# Patient Record
Sex: Female | Born: 1935 | Race: White | Hispanic: No
Health system: Southern US, Community
[De-identification: ages and names within clinical notes are randomized; demographics above are authoritative.]

---

## 2004-07-16 ENCOUNTER — Other Ambulatory Visit: Payer: Self-pay

## 2004-07-16 ENCOUNTER — Emergency Department: Payer: Self-pay | Admitting: Emergency Medicine

## 2004-09-07 ENCOUNTER — Emergency Department: Payer: Self-pay | Admitting: Emergency Medicine

## 2004-09-08 ENCOUNTER — Ambulatory Visit: Payer: Self-pay | Admitting: Emergency Medicine

## 2006-10-20 ENCOUNTER — Ambulatory Visit: Payer: Self-pay | Admitting: Internal Medicine

## 2006-12-22 ENCOUNTER — Emergency Department: Payer: Self-pay | Admitting: Emergency Medicine

## 2006-12-22 ENCOUNTER — Other Ambulatory Visit: Payer: Self-pay

## 2006-12-29 ENCOUNTER — Emergency Department: Payer: Self-pay | Admitting: Emergency Medicine

## 2007-01-05 ENCOUNTER — Ambulatory Visit: Payer: Self-pay | Admitting: Internal Medicine

## 2007-01-10 ENCOUNTER — Other Ambulatory Visit: Payer: Self-pay

## 2007-01-10 ENCOUNTER — Inpatient Hospital Stay: Payer: Self-pay | Admitting: Internal Medicine

## 2007-02-01 ENCOUNTER — Other Ambulatory Visit: Payer: Self-pay

## 2007-02-01 ENCOUNTER — Observation Stay: Payer: Self-pay | Admitting: Internal Medicine

## 2007-03-22 ENCOUNTER — Other Ambulatory Visit: Payer: Self-pay

## 2007-03-22 ENCOUNTER — Observation Stay: Payer: Self-pay | Admitting: Internal Medicine

## 2009-03-18 IMAGING — CR NECK SOFT TISSUES - 1+ VIEW
1 series · 2 of 2 positions shown · non-contrast
Comparison: none

REASON FOR EXAM: angioedema
COMMENTS:

[Series 1: view not recorded · 0.17mm/px · 2 of 2 slices shown]
[im 1/2]
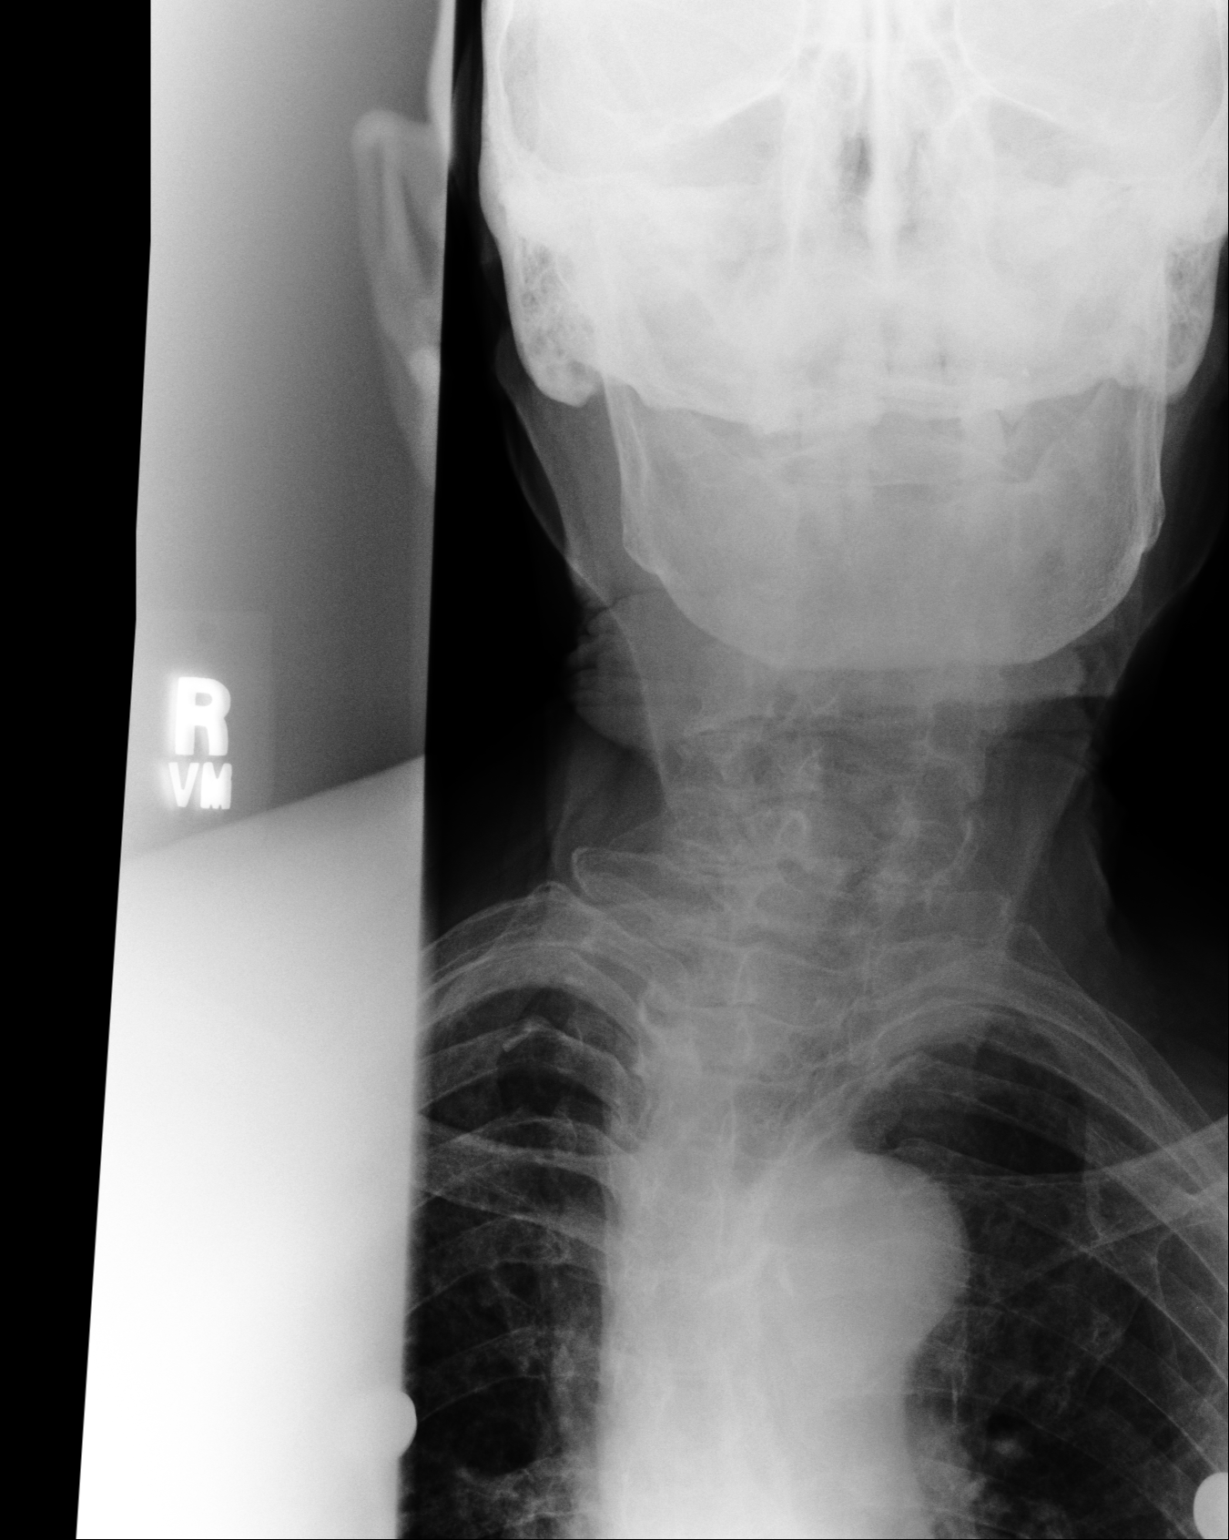
[im 2/2]
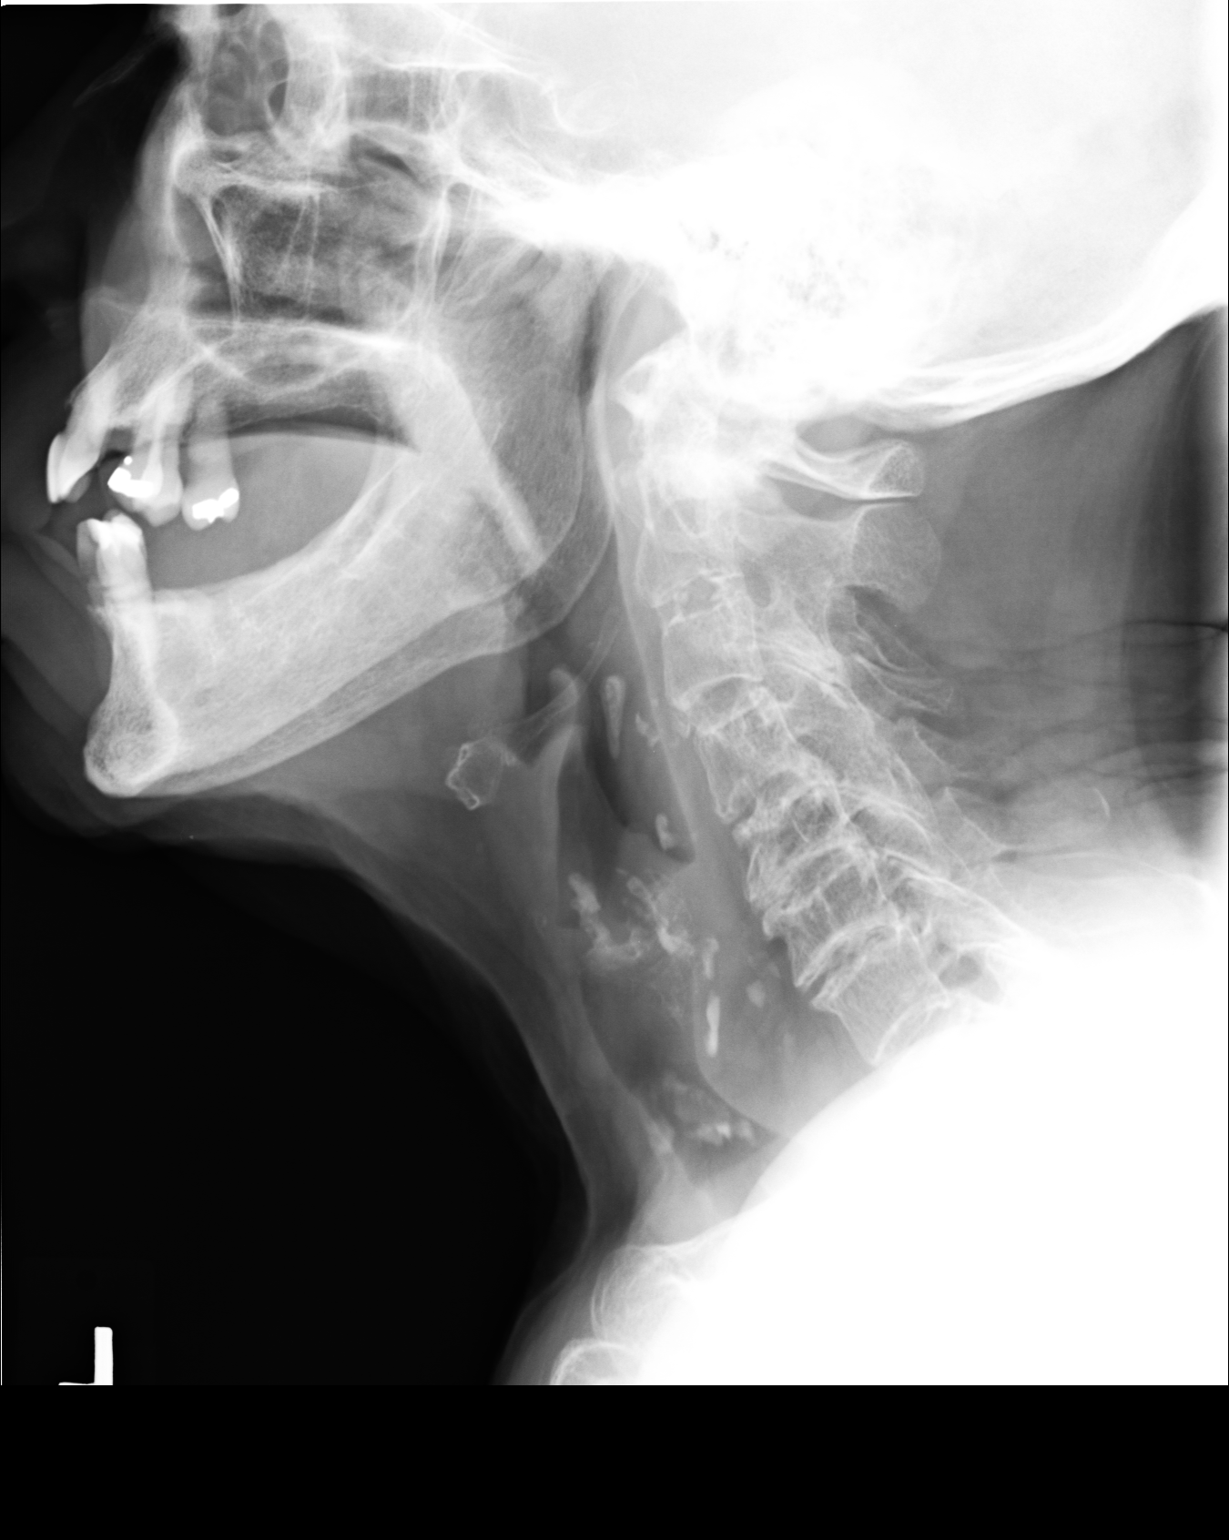

[2 of 2 positions shown; findings below may reference images not displayed]

PROCEDURE:     DXR - DXR SOFT TISSUE NECK  - February 01, 2007  [DATE]

RESULT:     Soft tissue AP and lateral views of the neck reveal the cervical
airway to be patent. There is a radiodensity that projects just posterior to
the epiglottis that is of uncertain etiology. There is extensive
calcification within the soft tissues of the neck which are not clearly
associated with the esophagus or airway but these are seen on the lateral
view only. There is degenerative disc disease of the spine.
IMPRESSION: 1. There is bony density lying posterior to the epiglottis on the lateral
film. If there are clinical concerns of ingestion of a foreign body, then
this would be considered a likely candidate.
2. There are calcifications that are more amorphous in appearance that
likely are related to the thyroid cartilage and to the carotid vessels.
3. I do not see evidence of epiglottic edema or prevertebral soft tissue
swelling.
4. There are degenerative changes of the spine.

## 2009-04-29 ENCOUNTER — Emergency Department: Payer: Self-pay | Admitting: Emergency Medicine

## 2011-03-24 ENCOUNTER — Emergency Department: Payer: Self-pay | Admitting: *Deleted

## 2014-01-04 ENCOUNTER — Other Ambulatory Visit: Payer: Self-pay | Admitting: Geriatric Medicine

## 2014-01-04 LAB — URINALYSIS, COMPLETE
Bacteria: NONE SEEN
Bilirubin,UR: NEGATIVE
Blood: NEGATIVE
Glucose,UR: NEGATIVE mg/dL (ref 0–75)
Ketone: NEGATIVE
NITRITE: NEGATIVE
Ph: 6 (ref 4.5–8.0)
Protein: NEGATIVE
RBC, UR: NONE SEEN /HPF (ref 0–5)
Specific Gravity: 1.01 (ref 1.003–1.030)
WBC UR: 120 /HPF (ref 0–5)

## 2014-01-06 LAB — URINE CULTURE

## 2014-01-10 ENCOUNTER — Emergency Department: Payer: Self-pay | Admitting: Student

## 2014-01-11 ENCOUNTER — Emergency Department: Payer: Self-pay | Admitting: Emergency Medicine

## 2014-01-11 LAB — BASIC METABOLIC PANEL
Anion Gap: 3 — ABNORMAL LOW (ref 7–16)
BUN: 25 mg/dL — ABNORMAL HIGH (ref 7–18)
CALCIUM: 8.8 mg/dL (ref 8.5–10.1)
CHLORIDE: 104 mmol/L (ref 98–107)
CO2: 30 mmol/L (ref 21–32)
CREATININE: 1 mg/dL (ref 0.60–1.30)
EGFR (African American): 55 — ABNORMAL LOW
GFR CALC NON AF AMER: 48 — AB
GLUCOSE: 144 mg/dL — AB (ref 65–99)
Osmolality: 281 (ref 275–301)
POTASSIUM: 3.9 mmol/L (ref 3.5–5.1)
Sodium: 137 mmol/L (ref 136–145)

## 2014-01-11 LAB — CBC
HCT: 31.2 % — ABNORMAL LOW (ref 35.0–47.0)
HGB: 10.4 g/dL — AB (ref 12.0–16.0)
MCH: 33.2 pg (ref 26.0–34.0)
MCHC: 33.5 g/dL (ref 32.0–36.0)
MCV: 99 fL (ref 80–100)
PLATELETS: 123 10*3/uL — AB (ref 150–440)
RBC: 3.15 10*6/uL — AB (ref 3.80–5.20)
RDW: 13.4 % (ref 11.5–14.5)
WBC: 7.7 10*3/uL (ref 3.6–11.0)

## 2014-01-11 LAB — PROTIME-INR
INR: 1.1
Prothrombin Time: 14.5 secs (ref 11.5–14.7)

## 2014-03-01 ENCOUNTER — Ambulatory Visit: Payer: Self-pay | Admitting: Internal Medicine

## 2014-03-08 ENCOUNTER — Emergency Department: Payer: Self-pay | Admitting: Emergency Medicine

## 2014-04-01 ENCOUNTER — Ambulatory Visit: Payer: Self-pay | Admitting: Internal Medicine

## 2014-09-22 NOTE — Consult Note (Signed)
   Comments   I have spoken by phone with pt's son, Lurlean NannyJohnny Wood, in [place] (# [phone]). He confirms that he is pt's Education officer, environmentaldecision-maker. We discussed disposition and pt's current status and he agrees with pt being transferred to the [facility].  Joseph ArtWoods does not currently have access to a fax machine and is therefore unable to sign consent forms. He requests that we allow his brother, Willette PaMichael Woods, who is with pt in the ER, to sign any necessary forms. I explained that 2 forms need to be signed: (1) revocation [facility] and (2) permission for admission to the [facility]. Bethann BerkshireJohnny agrees with his brother Casimiro NeedleMichael signing both of these forms.   Electronic Signatures: Niv Darley, Harriett SineNancy (MD)  (Signed 08-Oct-15 15:11)  Authored: Palliative Care   Last Updated: 08-Oct-15 15:11 by Dashley Monts, Harriett SineNancy (MD)

## 2014-09-22 NOTE — Consult Note (Signed)
PATIENT NAME:  Theresa Campbell, Theresa Campbell  DATE OF CONSULTATION:  01/11/2014  REFERRING PHYSICIAN:    CONSULTING PHYSICIAN:  Cristal Deerhristopher A. Kestrel Mis, MD  REASON FOR CONSULTATION: Right anterior medial leg wound.   HISTORY OF PRESENT ILLNESS:  Ms. Dante GangWhitesell is a pleasant 81 year old female who presented to the ED yesterday with an anterior leg wound, which was sutured up by the Emergency Room doctor. She presents after wound had separated with hematoma. According to family she fell and maybe hurt her leg on her wheelchair, otherwise has been doing fine.  No fevers, chills, night sweats, shortness of breath, cough, chest pain, abdominal pain, nausea, vomiting, diarrhea, constipation, dysuria, or hematuria.   PAST MEDICAL HISTORY: 1.  CVA.  2.  GERD.  3.  Bladder cancer.  4.  Peptic ulcer disease.  5.  Chronic back pain.  6.  Hyperlipidemia.  7.  Dementia.  8.  Osteoporosis.  9.  Arthritis.  10.  Hypertension.  11.  History of right total knee replacement.  12.  History of colon resection.  13.  History of bladder tack.  15.  History of knee surgery right.   HOME MEDICATIONS: Are trazodone, tramadol, spironolactone, ranitidine, ,  Colace, cranberry oral tablets, Cipro, and acetaminophen.   ALLERGIES: ACE INHIBITORS AND ASPIRIN.   SOCIAL HISTORY: Lives at the assisted living. No tobacco or alcohol use.   FAMILY HISTORY: Noncontributory.   REVIEW OF SYSTEMS: As above, 12 point documented pertinent positives and negatives as above.   PHYSICAL EXAMINATION: VITAL SIGNS: Temperature 98, pulse 82, blood pressure 116/58, respirations 18.  GENERAL: No acute distress, alert and oriented x 3.  HEAD: Normocephalic, atraumatic.  EYES: No scleral icterus. No conjunctivitis.  FACE: No obvious facial trauma, normal external nose, normal external ears.  CHEST: Lungs clear to auscultation moving air well.   HEART: Regular rate and rhythm. No murmurs, rubs,  or gallops.  ABDOMEN: Soft, nontender, and nondistended.  EXTREMITIES: Has an approximately 4 x 3 cm avulsion, degloving versus skin tear on the anterior medial right leg, tender to palpation. Does have some clots adherent to the wound, does have previously placed staples, which appeared to be torn.   LABORATORY: Are relatively unremarkable, INR is normal, hemoglobin 10.4.   IMAGING: Right tib-fib x-ray is no acute radiographic abnormality.   ASSESSMENT AND PLAN: Ms. Dante GangWhitesell is a pleasant 81 year old who has a skin tear versus partial degloving injury of her subcutaneous tissue, flap appears nonviable, would not attempt to resuture, would cut off flap in place and wet to dry; may follow up with me as an outpatient.   I have discussed this with Dr. Raelyn EnsignMark R Quale, who agrees with this plan.    ____________________________ Si Raiderhristopher A. Roxy Filler, MD cal:nt D: 01/11/2014 14:28:52 ET T: 01/11/2014 15:08:23 ET JOB#: [id number]  cc: Cristal Deerhristopher A. Bently Wyss, MD, <Dictator> Jarvis NewcomerHRISTOPHER A Desa Rech MD ELECTRONICALLY SIGNED 01/22/2014 21:07

## 2016-02-25 IMAGING — CR RIGHT TIBIA AND FIBULA - 2 VIEW
1 series · 5 of 5 positions shown · non-contrast
Comparison: No priors.

CLINICAL DATA: History of trauma from a fall.  Right leg pain.

EXAM:
RIGHT TIBIA AND FIBULA - 2 VIEW

[Series 1: x tib-fib lat right · 0.14mm/px · 5 of 5 slices shown]
[im 1/5]
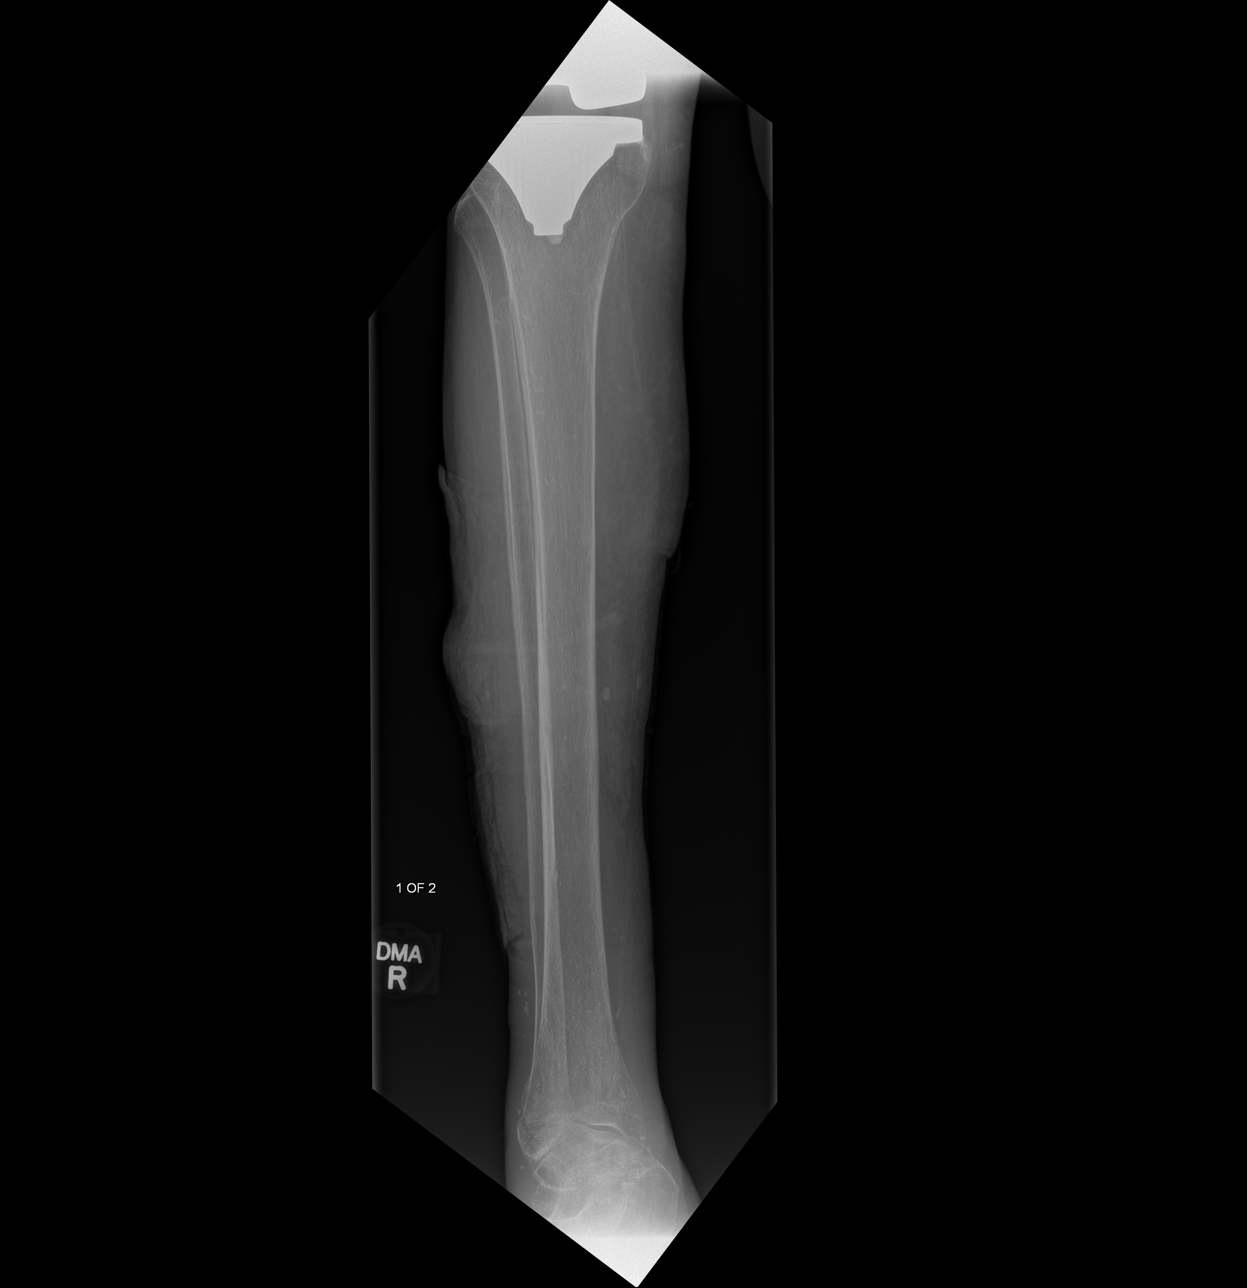
[im 2/5]
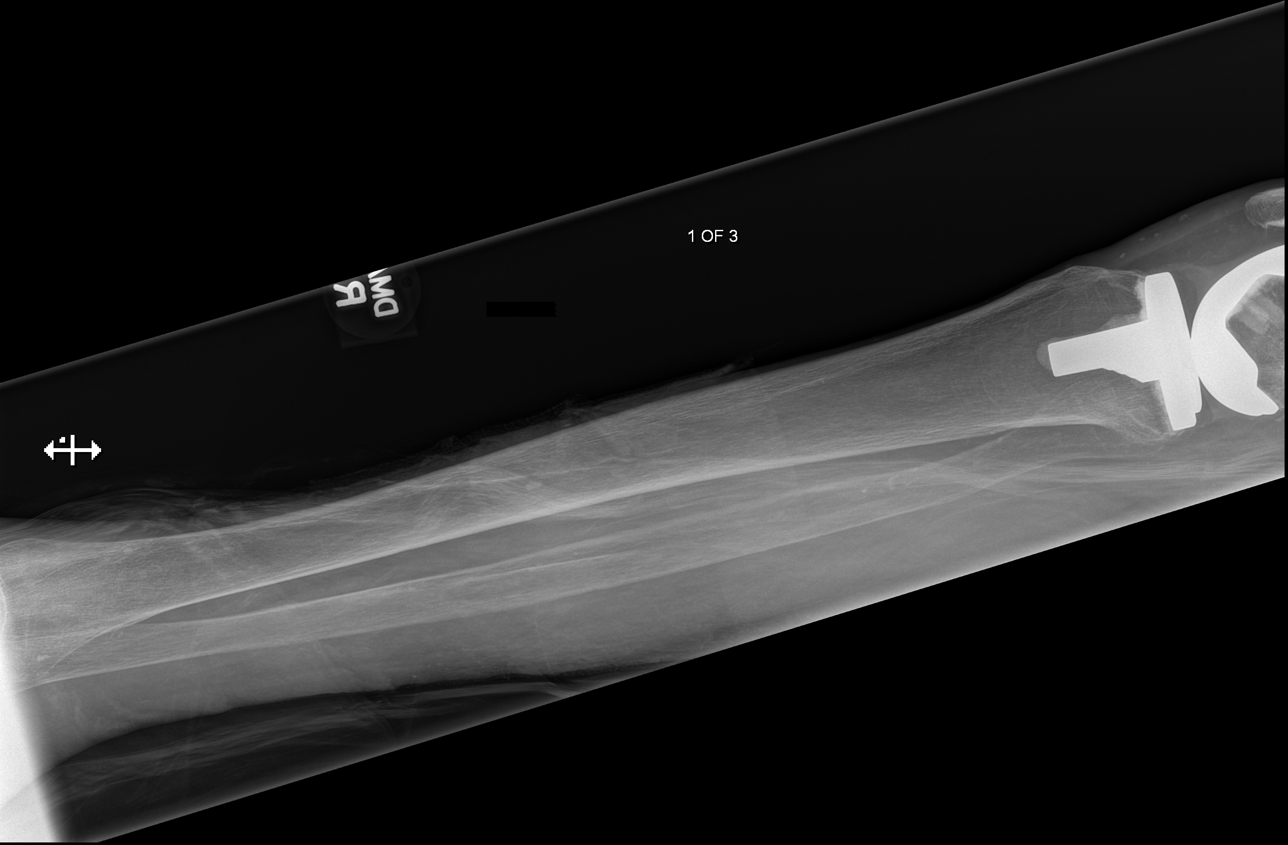
[im 3/5]
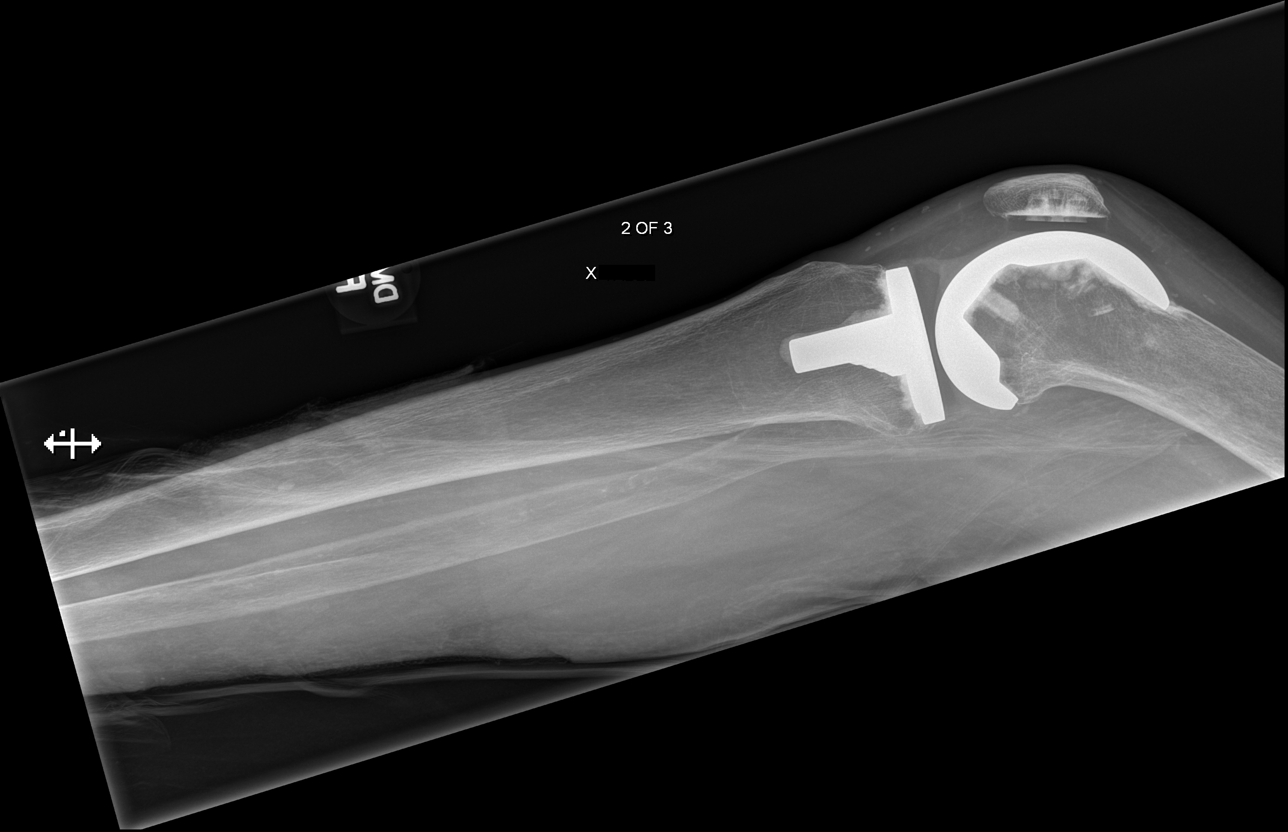
[im 4/5]
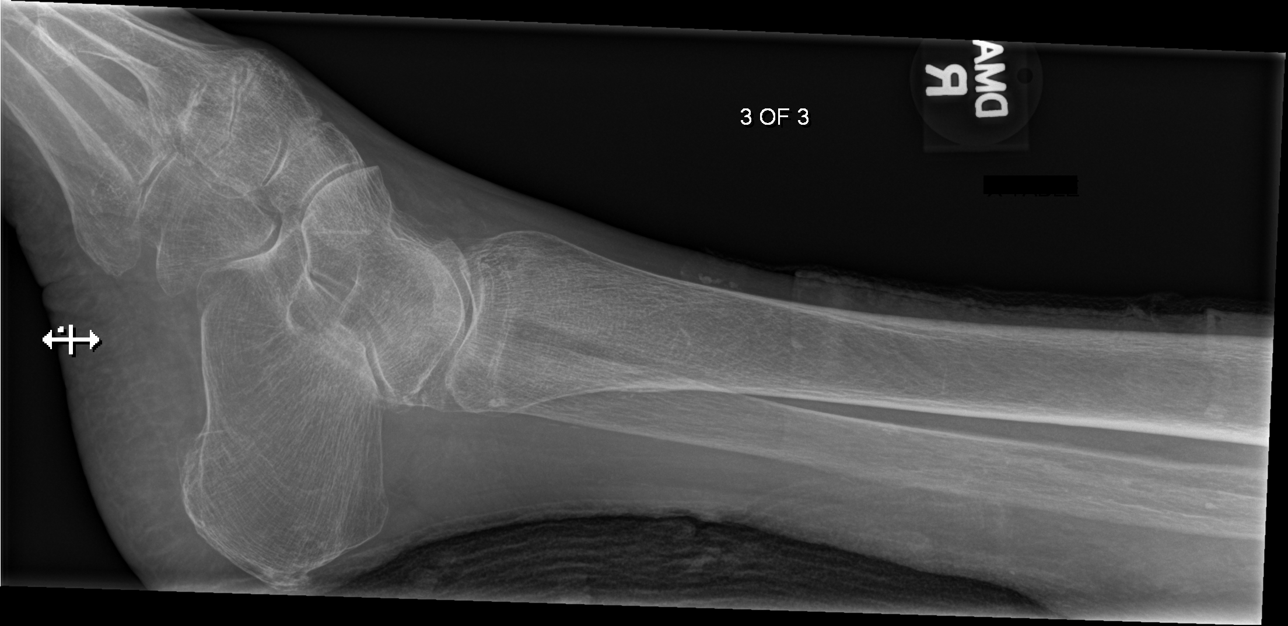
[im 5/5]
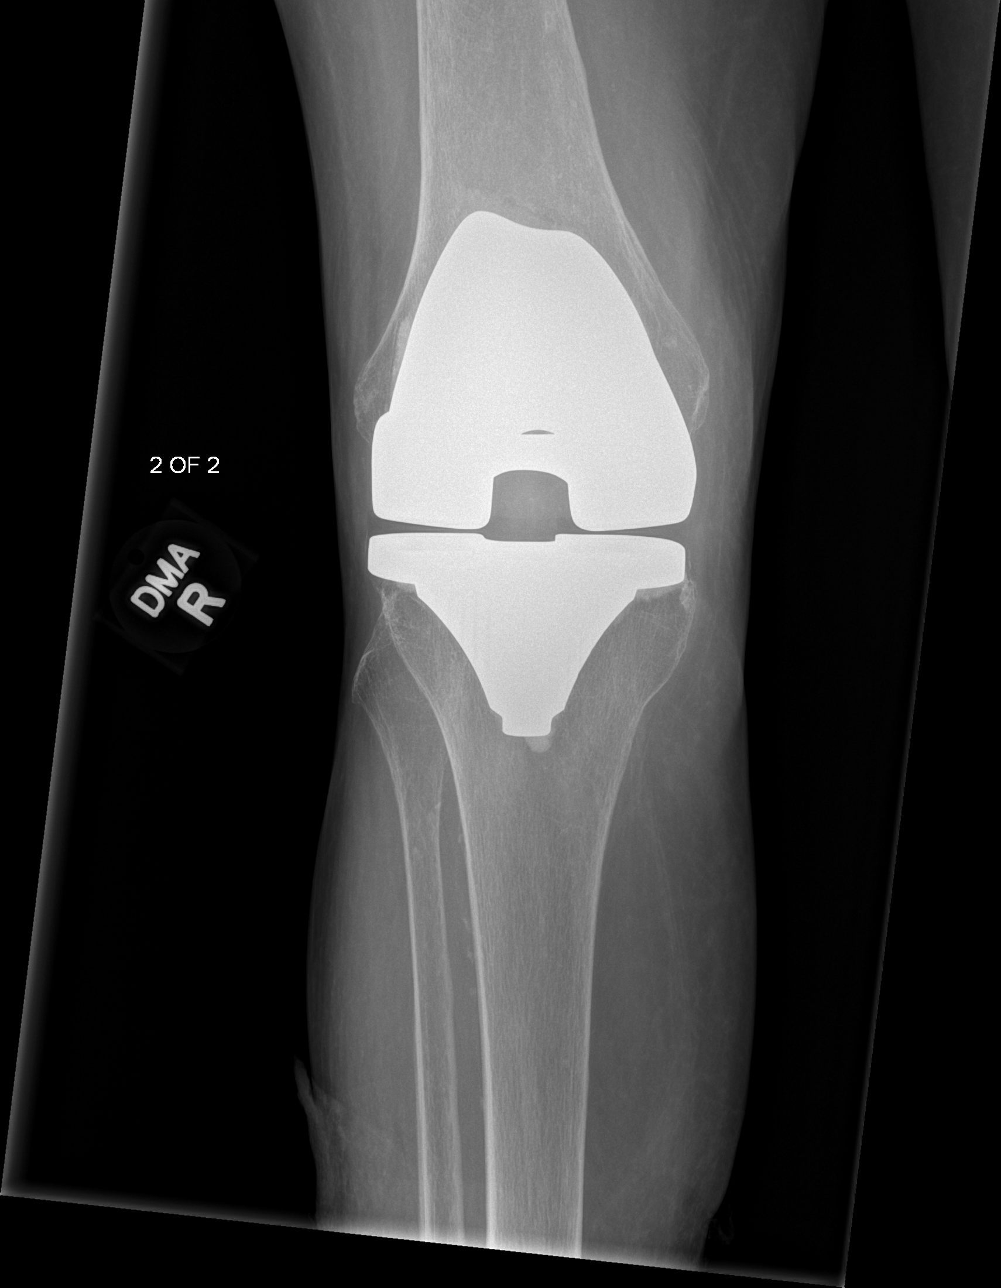

[5 of 5 positions shown; findings below may reference images not displayed]

FINDINGS: Multiple views of the right tibia and fibula demonstrate no acute
displaced fracture. Postoperative changes of right total knee
arthroplasty are noted. The femoral and tibial components of the
prosthesis appear to be properly seated without definite
periprosthetic fracture or other acute complicating features.
IMPRESSION: 1. No acute radiographic abnormality of the right tibia or fibula.
